# Patient Record
Sex: Male | Born: 1990 | Race: Black or African American | Hispanic: No | Marital: Single | Smoking: Never smoker
Health system: Southern US, Community
[De-identification: ages and names within clinical notes are randomized; demographics above are authoritative.]

## PROBLEM LIST (undated history)

## (undated) ENCOUNTER — Emergency Department (HOSPITAL_BASED_OUTPATIENT_CLINIC_OR_DEPARTMENT_OTHER): Payer: BC Managed Care – PPO

## (undated) DIAGNOSIS — R011 Cardiac murmur, unspecified: Secondary | ICD-10-CM

## (undated) DIAGNOSIS — R079 Chest pain, unspecified: Secondary | ICD-10-CM

## (undated) HISTORY — DX: Cardiac murmur, unspecified: R01.1

## (undated) HISTORY — PX: WISDOM TOOTH EXTRACTION: SHX21

## (undated) HISTORY — DX: Chest pain, unspecified: R07.9

---

## 2010-01-14 ENCOUNTER — Emergency Department (HOSPITAL_COMMUNITY): Admission: EM | Admit: 2010-01-14 | Discharge: 2010-01-14 | Payer: Self-pay | Admitting: Emergency Medicine

## 2011-10-08 ENCOUNTER — Emergency Department (HOSPITAL_COMMUNITY)
Admission: EM | Admit: 2011-10-08 | Discharge: 2011-10-08 | Disposition: A | Discharge status: Home / Self Care | Payer: PRIVATE HEALTH INSURANCE | Source: Home / Self Care | Attending: Family Medicine | Admitting: Family Medicine

## 2011-10-08 ENCOUNTER — Emergency Department (INDEPENDENT_AMBULATORY_CARE_PROVIDER_SITE_OTHER): Payer: PRIVATE HEALTH INSURANCE

## 2011-10-08 ENCOUNTER — Encounter: Payer: Self-pay | Admitting: *Deleted

## 2011-10-08 DIAGNOSIS — S93409A Sprain of unspecified ligament of unspecified ankle, initial encounter: Secondary | ICD-10-CM

## 2011-10-08 NOTE — ED Notes (Signed)
Pt  Reports  About 1  Week  Ago  He  inj  His  r  Ankle  -  He  Applied  A    Support  Bandage  - he  Was  Up  And  About  Bearing  Weight    approx 3  Hours  Ago  He  Twisted  The  Same  Ankle  Playing basketball  He  Reports  Pain  Especially on weight bearing

## 2011-10-08 NOTE — ED Provider Notes (Signed)
History     CSN: [id number] Arrival date & time: 10/08/2011  2:56 PM   First MD Initiated Contact with Patient 10/08/11 1457      Chief Complaint  Patient presents with  . Ankle Pain    (Consider location/radiation/quality/duration/timing/severity/associated sxs/prior treatment) HPI Comments: Jason Leblanc presents for evaluation of pain and swelling in his lateral RIGHT ankle after twisting it earlier today. He reports injuring it again earlier in the week. He now reports pain with walking.  Patient is a 20 y.o. male presenting with ankle pain. The history is provided by the patient.  Ankle Pain  The incident occurred 3 to 5 hours ago. The incident occurred at home. The injury mechanism was torsion. The pain is present in the right ankle. The pain is moderate. The pain has been constant since onset. Associated symptoms include inability to bear weight. Pertinent negatives include no numbness and no tingling. The symptoms are aggravated by activity, bearing weight and palpation. He has tried immobilization for the symptoms.    History reviewed. No pertinent past medical history.  History reviewed. No pertinent past surgical history.  History reviewed. No pertinent family history.  History  Substance Use Topics  . Smoking status: Never Smoker   . Smokeless tobacco: Not on file  . Alcohol Use: Yes     Occasonally      Review of Systems  Constitutional: Negative.   HENT: Negative.   Eyes: Negative.   Respiratory: Negative.   Cardiovascular: Negative.   Gastrointestinal: Negative.   Genitourinary: Negative.   Musculoskeletal: Positive for arthralgias and gait problem.  Skin: Negative.   Neurological: Negative.  Negative for tingling and numbness.    Allergies  Review of patient's allergies indicates no known allergies.  Home Medications  No current outpatient prescriptions on file.  BP 114/67  Pulse 56  Temp(Src) 98.3 F (36.8 C) (Oral)  Resp 16  SpO2  100%  Physical Exam  Nursing note and vitals reviewed. Constitutional: He is oriented to person, place, and time. He appears well-developed and well-nourished.  HENT:  Head: Normocephalic and atraumatic.  Eyes: EOM are normal.  Neck: Normal range of motion.  Pulmonary/Chest: Effort normal.  Musculoskeletal: Normal range of motion.       Right ankle: He exhibits swelling. He exhibits no deformity. tenderness. Lateral malleolus, AITFL, CF ligament, posterior TFL and proximal fibula tenderness found. No medial malleolus and no head of 5th metatarsal tenderness found.       Feet:  Neurological: He is alert and oriented to person, place, and time.  Skin: Skin is warm and dry.  Psychiatric: His behavior is normal.    ED Course  Procedures (including critical care time)  Labs Reviewed - No data to display Dg Ankle Complete Right  10/08/2011  *RADIOLOGY REPORT*  Clinical Data: Injury pain laterally.  RIGHT ANKLE - COMPLETE 3+ VIEW  Comparison: None.  Findings: Soft tissue swelling lateral malleolar region without underlying fracture or dislocation.  IMPRESSION: Soft tissue swelling lateral malleolar region.  No fracture or dislocation  Original Report Authenticated By: Jason Leblanc, M.D.     1. Ankle sprain       MDM  Xray, RIGHT ankle: reviewed by radiologist and myself; no acute findings, no fracture        Richardo Priest, MD 10/08/11 1635

## 2013-02-27 ENCOUNTER — Emergency Department (HOSPITAL_COMMUNITY)
Admission: EM | Admit: 2013-02-27 | Discharge: 2013-02-27 | Disposition: A | Discharge status: Home / Self Care | Payer: BC Managed Care – PPO | Attending: Emergency Medicine | Admitting: Emergency Medicine

## 2013-02-27 ENCOUNTER — Encounter (HOSPITAL_COMMUNITY): Payer: Self-pay | Admitting: *Deleted

## 2013-02-27 DIAGNOSIS — R109 Unspecified abdominal pain: Secondary | ICD-10-CM

## 2013-02-27 DIAGNOSIS — R1084 Generalized abdominal pain: Secondary | ICD-10-CM | POA: Insufficient documentation

## 2013-02-27 LAB — CBC WITH DIFFERENTIAL/PLATELET
Eosinophils Relative: 4 % (ref 0–5)
HCT: 38 % — ABNORMAL LOW (ref 39.0–52.0)
Lymphocytes Relative: 43 % (ref 12–46)
Lymphs Abs: 2.5 10*3/uL (ref 0.7–4.0)
MCV: 81.2 fL (ref 78.0–100.0)
Monocytes Absolute: 0.3 10*3/uL (ref 0.1–1.0)
Platelets: 236 10*3/uL (ref 150–400)
RBC: 4.68 MIL/uL (ref 4.22–5.81)
WBC: 5.8 10*3/uL (ref 4.0–10.5)

## 2013-02-27 LAB — COMPREHENSIVE METABOLIC PANEL
ALT: 10 U/L (ref 0–53)
CO2: 27 mEq/L (ref 19–32)
Calcium: 9.4 mg/dL (ref 8.4–10.5)
GFR calc Af Amer: >90 mL/min (ref >90)
GFR calc non Af Amer: >90 mL/min (ref >90)
Glucose, Bld: 94 mg/dL (ref 70–99)
Sodium: 142 mEq/L (ref 135–145)

## 2013-02-27 LAB — URINALYSIS, ROUTINE W REFLEX MICROSCOPIC
Bilirubin Urine: NEGATIVE
Hgb urine dipstick: NEGATIVE
Ketones, ur: NEGATIVE mg/dL
Protein, ur: NEGATIVE mg/dL
Urobilinogen, UA: 1 mg/dL (ref 0.0–1.0)

## 2013-02-27 NOTE — ED Notes (Signed)
Pt presents with c/c of RLQ abdominal pain, st's pain started about 20-25 min ago.  Pain is characterized as sharp, no rebound tenderness present.  No n/v, reports diarrhea yesterday.

## 2013-02-27 NOTE — ED Provider Notes (Signed)
History     CSN: [id number]  Arrival date & time 02/27/13  0529   First MD Initiated Contact with Patient 02/27/13 0600      No chief complaint on file.   (Consider location/radiation/quality/duration/timing/severity/associated sxs/prior treatment) Patient is a 22 y.o. male presenting with abdominal pain. The history is provided by the patient.  Abdominal Pain Pain location:  Generalized Associated symptoms: no chest pain, no diarrhea, no dysuria, no fever, no hematuria, no nausea and no vomiting   Associated symptoms comment:  Abdominal discomfort that started this morning in the RLQ, then moved to epigastric and LLQ areas before resolving. No fever, N, V, or change in bowel habit.    History reviewed. No pertinent past medical history.  History reviewed. No pertinent past surgical history.  No family history on file.  History  Substance Use Topics  . Smoking status: Never Smoker   . Smokeless tobacco: Not on file  . Alcohol Use: Yes     Comment: Occasonally      Review of Systems  Constitutional: Negative.  Negative for fever.  Respiratory: Negative.   Cardiovascular: Negative.  Negative for chest pain.  Gastrointestinal: Positive for abdominal pain. Negative for nausea, vomiting and diarrhea.  Genitourinary: Negative for dysuria, hematuria and flank pain.  Musculoskeletal: Negative.  Negative for myalgias.  Skin: Negative.  Negative for rash.  Psychiatric/Behavioral: Negative for confusion.    Allergies  Review of patient's allergies indicates no known allergies.  Home Medications  No current outpatient prescriptions on file.  BP 151/92  Pulse 59  Temp(Src) 98.4 F (36.9 C) (Oral)  Resp 22  SpO2 100%  Physical Exam  Constitutional: He is oriented to person, place, and time. He appears well-developed and well-nourished. No distress.  HENT:  Head: Normocephalic.  Neck: Normal range of motion. Neck supple.  Cardiovascular: Normal rate and regular  rhythm.   Pulmonary/Chest: Effort normal and breath sounds normal.  Abdominal: Soft. Bowel sounds are normal. There is no tenderness. There is no rebound and no guarding.  Abdomen is completely non-tender to palpation in every quadrant. Soft, normal bowel sounds.   Musculoskeletal: Normal range of motion.  Neurological: He is alert and oriented to person, place, and time.  Skin: Skin is warm and dry. No rash noted.  Psychiatric: He has a normal mood and affect.    ED Course  Procedures (including critical care time)  Labs Reviewed  CBC WITH DIFFERENTIAL - Abnormal; Notable for the following:    HCT 38.0 (*)    All other components within normal limits  COMPREHENSIVE METABOLIC PANEL  LIPASE, BLOOD  URINALYSIS, ROUTINE W REFLEX MICROSCOPIC   Results for orders placed during the hospital encounter of 02/27/13  CBC WITH DIFFERENTIAL      Result Value Range   WBC 5.8  4.0 - 10.5 K/uL   RBC 4.68  4.22 - 5.81 MIL/uL   Hemoglobin 13.1  13.0 - 17.0 g/dL   HCT 46.9 (*) 62.9 - 52.8 %   MCV 81.2  78.0 - 100.0 fL   MCH 28.0  26.0 - 34.0 pg   MCHC 34.5  30.0 - 36.0 g/dL   RDW 41.3  24.4 - 01.0 %   Platelets 236  150 - 400 K/uL   Neutrophils Relative 48  43 - 77 %   Neutro Abs 2.8  1.7 - 7.7 K/uL   Lymphocytes Relative 43  12 - 46 %   Lymphs Abs 2.5  0.7 - 4.0 K/uL   Monocytes  Relative 5  3 - 12 %   Monocytes Absolute 0.3  0.1 - 1.0 K/uL   Eosinophils Relative 4  0 - 5 %   Eosinophils Absolute 0.2  0.0 - 0.7 K/uL   Basophils Relative 1  0 - 1 %   Basophils Absolute 0.0  0.0 - 0.1 K/uL  COMPREHENSIVE METABOLIC PANEL      Result Value Range   Sodium 142  135 - 145 mEq/L   Potassium 3.7  3.5 - 5.1 mEq/L   Chloride 104  96 - 112 mEq/L   CO2 27  19 - 32 mEq/L   Glucose, Bld 94  70 - 99 mg/dL   BUN 9  6 - 23 mg/dL   Creatinine, Ser 2.13  0.50 - 1.35 mg/dL   Calcium 9.4  8.4 - 08.6 mg/dL   Total Protein 7.1  6.0 - 8.3 g/dL   Albumin 4.1  3.5 - 5.2 g/dL   AST 37  0 - 37 U/L   ALT 10   0 - 53 U/L   Alkaline Phosphatase 63  39 - 117 U/L   Total Bilirubin 1.0  0.3 - 1.2 mg/dL   GFR calc non Af Amer >90  >90 mL/min   GFR calc Af Amer >90  >90 mL/min  LIPASE, BLOOD      Result Value Range   Lipase 48  11 - 59 U/L  URINALYSIS, ROUTINE W REFLEX MICROSCOPIC      Result Value Range   Color, Urine YELLOW  YELLOW   APPearance CLEAR  CLEAR   Specific Gravity, Urine 1.021  1.005 - 1.030   pH 5.5  5.0 - 8.0   Glucose, UA NEGATIVE  NEGATIVE mg/dL   Hgb urine dipstick NEGATIVE  NEGATIVE   Bilirubin Urine NEGATIVE  NEGATIVE   Ketones, ur NEGATIVE  NEGATIVE mg/dL   Protein, ur NEGATIVE  NEGATIVE mg/dL   Urobilinogen, UA 1.0  0.0 - 1.0 mg/dL   Nitrite NEGATIVE  NEGATIVE   Leukocytes, UA NEGATIVE  NEGATIVE    No results found.   No diagnosis found. 1. Abdominal pain, resolved   MDM  He remains pain-free on re-evaluation. Labs unremarkable. He is stable for discharge.         Arnoldo Hooker, PA-C 02/27/13 [phone]

## 2013-02-27 NOTE — ED Provider Notes (Signed)
Medical screening examination/treatment/procedure(s) were performed by non-physician practitioner and as supervising physician I was immediately available for consultation/collaboration.   Bert Givans B. Yohanna Tow, MD 02/27/13 0852 

## 2019-01-06 ENCOUNTER — Ambulatory Visit
Admission: EM | Admit: 2019-01-06 | Discharge: 2019-01-06 | Disposition: A | Discharge status: Home / Self Care | Payer: BLUE CROSS/BLUE SHIELD | Attending: Family Medicine | Admitting: Family Medicine

## 2019-01-06 DIAGNOSIS — Z113 Encounter for screening for infections with a predominantly sexual mode of transmission: Secondary | ICD-10-CM | POA: Insufficient documentation

## 2019-01-06 NOTE — Discharge Instructions (Signed)
We are testing you for Gonorrhea, Chlamydia and Trichomonas. We will call you if anything is positive and let you know if you require any further treatment. Please inform partner of any positive results.  Please return if symptoms not improving with treatment, development of fever, nausea, vomiting, abdominal pain, scrotal pain. 

## 2019-01-06 NOTE — ED Triage Notes (Signed)
Pt states wants a STD screening, denies sx's.

## 2019-01-07 NOTE — ED Provider Notes (Signed)
[facility]    CSN: [id number] Arrival date & time: 01/06/19  1450     History   Chief Complaint Chief Complaint  Patient presents with  . Exposure to STD    HPI Jason Leblanc is a 28 y.o. male notes no past medical history presenting today for STD screening.  Patient states that he is concerned about herpes as his partner recently tested positive for herpes.  Neither him or his partner have had any rashes and believes that she had a blood test obtained.  He has not had any symptoms, denies any penile lesions, dysuria, penile discharge.  Denies other sexual partners.  They have been together for many years and neither have had any rashes or breakouts.   HPI  History reviewed. No pertinent past medical history.  There are no active problems to display for this patient.   History reviewed. No pertinent surgical history.     Home Medications    Prior to Admission medications   Not on File    Family History No family history on file.  Social History Social History   Tobacco Use  . Smoking status: Never Smoker  . Smokeless tobacco: Never Used  Substance Use Topics  . Alcohol use: Yes    Comment: Occasonally  . Drug use: No     Allergies   Patient has no known allergies.   Review of Systems Review of Systems  Constitutional: Negative for fever.  HENT: Negative for sore throat.   Respiratory: Negative for shortness of breath.   Cardiovascular: Negative for chest pain.  Gastrointestinal: Negative for abdominal pain, nausea and vomiting.  Genitourinary: Negative for difficulty urinating, discharge, dysuria, frequency, penile pain, penile swelling, scrotal swelling and testicular pain.  Skin: Negative for rash.  Neurological: Negative for dizziness, light-headedness and headaches.     Physical Exam Triage Vital Signs ED Triage Vitals  Enc Vitals Group     BP 01/06/19 1515 125/78     Pulse Rate 01/06/19 1515 60     Resp 01/06/19 1515  18     Temp 01/06/19 1515 98.1 F (36.7 C)     Temp Source 01/06/19 1515 Oral     SpO2 01/06/19 1515 96 %     Weight --      Height --      Head Circumference --      Peak Flow --      Pain Score 01/06/19 1516 0     Pain Loc --      Pain Edu? --      Excl. in [place]? --    No data found.  Updated Vital Signs BP 125/78 (BP Location: Left Arm)   Pulse 60   Temp 98.1 F (36.7 C) (Oral)   Resp 18   SpO2 96%   Visual Acuity Right Eye Distance:   Left Eye Distance:   Bilateral Distance:    Right Eye Near:   Left Eye Near:    Bilateral Near:     Physical Exam Vitals signs and nursing note reviewed.  Constitutional:      Appearance: He is well-developed.     Comments: No acute distress  HENT:     Head: Normocephalic and atraumatic.     Nose: Nose normal.  Eyes:     Conjunctiva/sclera: Conjunctivae normal.  Neck:     Musculoskeletal: Neck supple.  Cardiovascular:     Rate and Rhythm: Normal rate.  Pulmonary:     Effort: Pulmonary effort is  normal. No respiratory distress.  Abdominal:     General: There is no distension.  Musculoskeletal: Normal range of motion.  Skin:    General: Skin is warm and dry.  Neurological:     Mental Status: He is alert and oriented to person, place, and time.       UC Treatments / Results  Labs (all labs ordered are listed, but only abnormal results are displayed) Labs Reviewed  URINE CYTOLOGY ANCILLARY ONLY    EKG None  Radiology No results found.  Procedures Procedures (including critical care time)  Medications Ordered in UC Medications - No data to display  Initial Impression / Assessment and Plan / UC Course  I have reviewed the triage vital signs and the nursing notes.  Pertinent labs & imaging results that were available during my care of the patient were reviewed by me and considered in my medical decision making (see chart for details).     Patient with concern for herpes, discussed unreliable testing  through serology and discussed testing for this is not recommended unless having concerning lesions.  Patient verbalized understanding, did wish to proceed with testing of urine for gonorrhea, chlamydia and trichomonas.  He wished to defer HIV and syphilis testing.  We will hold off on any treatment at this time as he is asymptomatic, will provide treatment if needed.Discussed strict return precautions. Patient verbalized understanding and is agreeable with plan.  Final Clinical Impressions(s) / UC Diagnoses   Final diagnoses:  Screen for STD (sexually transmitted disease)     Discharge Instructions     We are testing you for Gonorrhea, Chlamydia and Trichomonas. We will call you if anything is positive and let you know if you require any further treatment. Please inform partner of any positive results.  Please return if symptoms not improving with treatment, development of fever, nausea, vomiting, abdominal pain, scrotal pain.   ED Prescriptions    None     Controlled Substance Prescriptions [place] Controlled Substance Registry consulted? Not Applicable   Lew Dawes, [place] 01/07/19 1201

## 2019-01-10 LAB — URINE CYTOLOGY ANCILLARY ONLY
Chlamydia: NEGATIVE
Neisseria Gonorrhea: NEGATIVE
TRICH (WINDOWPATH): NEGATIVE

## 2019-05-26 ENCOUNTER — Other Ambulatory Visit: Payer: Self-pay

## 2019-05-26 ENCOUNTER — Ambulatory Visit (INDEPENDENT_AMBULATORY_CARE_PROVIDER_SITE_OTHER): Payer: BC Managed Care – PPO

## 2019-05-26 ENCOUNTER — Ambulatory Visit
Admission: EM | Admit: 2019-05-26 | Discharge: 2019-05-26 | Disposition: A | Discharge status: Home / Self Care | Payer: BC Managed Care – PPO | Attending: Family Medicine | Admitting: Family Medicine

## 2019-05-26 DIAGNOSIS — S63501A Unspecified sprain of right wrist, initial encounter: Secondary | ICD-10-CM

## 2019-05-26 DIAGNOSIS — W010XXA Fall on same level from slipping, tripping and stumbling without subsequent striking against object, initial encounter: Secondary | ICD-10-CM

## 2019-05-26 MED ORDER — IBUPROFEN 800 MG PO TABS: 800.0000 mg | ORAL_TABLET | Freq: Three times a day (TID) | ORAL | 0 refills | Status: AC

## 2019-05-26 NOTE — ED Triage Notes (Signed)
Per pt he fell last night chasing his dog and used his hands to brace himself. Woke up this morning and his right wrist is sore, and hard to grip. Some swelling no deformity noted.

## 2019-05-26 NOTE — ED Provider Notes (Signed)
[facility]    CSN: [id number] Arrival date & time: 05/26/19  1044      History   Chief Complaint Chief Complaint  Patient presents with  . Wrist Injury    HPI Hilton Saephan is a 28 y.o. male no significant past medical history presenting today for evaluation of wrist injury.  Patient states that last night he was chasing his dog slipped and fell the with outstretched hand.  He denied initial pain.  When he woke up this morning he noticed his wrist was sore and has had difficulty gripping especially at work.  He is an English as a second language teacher.  He denies previous injury to this wrist/hand.  Denies numbness or tingling.  HPI  No past medical history on file.  There are no active problems to display for this patient.   No past surgical history on file.     Home Medications    Prior to Admission medications   Medication Sig Start Date End Date Taking? Authorizing Provider  ibuprofen (ADVIL) 800 MG tablet Take 1 tablet (800 mg total) by mouth 3 (three) times daily. 05/26/19   , Elesa Hacker, PA-C    Family History No family history on file.  Social History Social History   Tobacco Use  . Smoking status: Never Smoker  . Smokeless tobacco: Never Used  Substance Use Topics  . Alcohol use: Yes    Comment: Occasonally  . Drug use: No     Allergies   Patient has no known allergies.   Review of Systems Review of Systems  Constitutional: Negative for fatigue and fever.  Eyes: Negative for redness, itching and visual disturbance.  Respiratory: Negative for shortness of breath.   Cardiovascular: Negative for chest pain and leg swelling.  Gastrointestinal: Negative for nausea and vomiting.  Musculoskeletal: Positive for arthralgias. Negative for joint swelling and myalgias.  Skin: Negative for color change, rash and wound.  Neurological: Negative for dizziness, syncope, weakness, light-headedness and headaches.     Physical Exam Triage Vital  Signs ED Triage Vitals  Enc Vitals Group     BP 05/26/19 1053 121/79     Pulse Rate 05/26/19 1053 60     Resp 05/26/19 1053 16     Temp 05/26/19 1053 97.9 F (36.6 C)     Temp Source 05/26/19 1053 Oral     SpO2 05/26/19 1053 98 %     Weight --      Height --      Head Circumference --      Peak Flow --      Pain Score 05/26/19 1052 3     Pain Loc --      Pain Edu? --      Excl. in [place]? --    No data found.  Updated Vital Signs BP 121/79 (BP Location: Right Arm)   Pulse 60   Temp 97.9 F (36.6 C) (Oral)   Resp 16   SpO2 98%   Visual Acuity Right Eye Distance:   Left Eye Distance:   Bilateral Distance:    Right Eye Near:   Left Eye Near:    Bilateral Near:     Physical Exam Vitals signs and nursing note reviewed.  Constitutional:      Appearance: He is well-developed.     Comments: No acute distress  HENT:     Head: Normocephalic and atraumatic.     Nose: Nose normal.  Eyes:     Conjunctiva/sclera: Conjunctivae normal.  Neck:  Musculoskeletal: Neck supple.  Cardiovascular:     Rate and Rhythm: Normal rate.  Pulmonary:     Effort: Pulmonary effort is normal. No respiratory distress.  Abdominal:     General: There is no distension.  Musculoskeletal: Normal range of motion.     Comments: Right wrist: no obvious swelling deformity or discoloration. Tenderness to palpation of distal radius, mild snuffbox tenderness,   Nontender to distal ulna, throughout second through fifth metacarpal. Relatively full active range of motion of wrist, full active range of motion of fingers Radial pulse 2+  Skin:    General: Skin is warm and dry.  Neurological:     Mental Status: He is alert and oriented to person, place, and time.      UC Treatments / Results  Labs (all labs ordered are listed, but only abnormal results are displayed) Labs Reviewed - No data to display  EKG   Radiology Dg Wrist Complete Right  Result Date: 05/26/2019 CLINICAL DATA:  Larey SeatFell on  outstretched hand last night, pain along distal radius, mild snuffbox tenderness EXAM: RIGHT WRIST - COMPLETE 3+ VIEW COMPARISON:  None FINDINGS: Osseous mineralization normal. Joint spaces preserved. No fracture, dislocation, or bone destruction. IMPRESSION: Normal exam. Electronically Signed   By: Ulyses SouthwardMark  Boles M.D.   On: 05/26/2019 11:38    Procedures Procedures (including critical care time)  Medications Ordered in UC Medications - No data to display  Initial Impression / Assessment and Plan / UC Course  I have reviewed the triage vital signs and the nursing notes.  Pertinent labs & imaging results that were available during my care of the patient were reviewed by me and considered in my medical decision making (see chart for details).     X-ray negative, most likely wrist sprain.  Will place in thumb spica given mild snuffbox tenderness.  Anti-inflammatories ice.  Recommended follow-up in 2 weeks if not having improvement for repeat x-ray.Discussed strict return precautions. Patient verbalized understanding and is agreeable with plan.  Final Clinical Impressions(s) / UC Diagnoses   Final diagnoses:  Sprain of right wrist, initial encounter     Discharge Instructions     No fracture Wear brace next 1-2 weeks Use anti-inflammatories for pain/swelling. You may take up to 800 mg Ibuprofen every 8 hours with food. You may supplement Ibuprofen with Tylenol [phone] mg every 8 hours.  Ice  Follow up if not improving in 2 weeks   ED Prescriptions    Medication Sig Dispense Auth. Provider   ibuprofen (ADVIL) 800 MG tablet Take 1 tablet (800 mg total) by mouth 3 (three) times daily. 21 tablet , [place] C, PA-C     Controlled Substance Prescriptions [place] Controlled Substance Registry consulted? Not Applicable   Lew Dawes,  C, [place] 05/26/19 1315

## 2019-05-26 NOTE — Discharge Instructions (Signed)
No fracture Wear brace next 1-2 weeks Use anti-inflammatories for pain/swelling. You may take up to 800 mg Ibuprofen every 8 hours with food. You may supplement Ibuprofen with Tylenol [phone] mg every 8 hours.  Ice  Follow up if not improving in 2 weeks

## 2019-09-20 ENCOUNTER — Ambulatory Visit
Admission: EM | Admit: 2019-09-20 | Discharge: 2019-09-20 | Disposition: A | Discharge status: Home / Self Care | Payer: BC Managed Care – PPO | Attending: Physician Assistant | Admitting: Physician Assistant

## 2019-09-20 DIAGNOSIS — R109 Unspecified abdominal pain: Secondary | ICD-10-CM | POA: Diagnosis not present

## 2019-09-20 DIAGNOSIS — R110 Nausea: Secondary | ICD-10-CM | POA: Diagnosis not present

## 2019-09-20 MED ORDER — FAMOTIDINE 20 MG PO TABS: 20.0000 mg | ORAL_TABLET | Freq: Every day | ORAL | 0 refills | Status: DC

## 2019-09-20 MED ORDER — DICYCLOMINE HCL 20 MG PO TABS: 20.0000 mg | ORAL_TABLET | Freq: Two times a day (BID) | ORAL | 0 refills | Status: DC

## 2019-09-20 NOTE — ED Provider Notes (Signed)
[facility]    CSN: [id number] Arrival date & time: 09/20/19  0910      History   Chief Complaint Chief Complaint  Patient presents with   Abdominal Pain    HPI Jason Leblanc is a 28 y.o. male.   28 year old male comes in for 6-day history of low abdominal pain with nausea.  Patient states 09/01/2019, he had his wisdom teeth removed, and was taking penicillin, Vicodin, Medrol dose pack, ibuprofen as directed.  He finished the medications without problems, but had regular bowel movements during that time.  Started noticing bilateral lower abdominal pain that is sharp/pulsing in sensation.  This is relieved by cold water.  States started drinking celery juice to help with bowel movements.  4 days ago had worsening abdominal pain, with pain being constant.  He had hard solid stool with clear mucus/red streaks 3 days ago, which relieved pain significantly.  He had one episode of self-induced vomiting with no relief of symptoms.  Had intermittent nausea.  Denies fever, chills, body aches.  States was going to see PCP yesterday, but was not able to get appointment.  States came in today as symptoms has not completely resolved.  However, compared to 4 days ago, symptoms have greatly improved.  He now has no nausea, and has intermittent low abdominal discomfort.  He has been eating and drinking normally. Denies history of abdominal surgery.      History reviewed. No pertinent past medical history.  There are no active problems to display for this patient.   Past Surgical History:  Procedure Laterality Date   WISDOM TOOTH EXTRACTION         Home Medications    Prior to Admission medications   Medication Sig Start Date End Date Taking? Authorizing Provider  dicyclomine (BENTYL) 20 MG tablet Take 1 tablet (20 mg total) by mouth 2 (two) times daily. 09/20/19   Tasia Catchings, Andi Layfield V, PA-C  famotidine (PEPCID) 20 MG tablet Take 1 tablet (20 mg total) by mouth daily. 09/20/19   Tasia Catchings,  Sherril Heyward V, PA-C  ibuprofen (ADVIL) 800 MG tablet Take 1 tablet (800 mg total) by mouth 3 (three) times daily. 05/26/19   Wieters, Elesa Hacker, PA-C    Family History No family history on file.  Social History Social History   Tobacco Use   Smoking status: Never Smoker   Smokeless tobacco: Never Used  Substance Use Topics   Alcohol use: Not Currently    Comment: Occasonally   Drug use: No     Allergies   Patient has no known allergies.   Review of Systems Review of Systems  Reason unable to perform ROS: See HPI as above.     Physical Exam Triage Vital Signs ED Triage Vitals  Enc Vitals Group     BP 09/20/19 0920 115/85     Pulse Rate 09/20/19 0920 96     Resp 09/20/19 0920 16     Temp 09/20/19 0920 97.7 F (36.5 C)     Temp Source 09/20/19 0920 Oral     SpO2 09/20/19 0920 98 %     Weight --      Height --      Head Circumference --      Peak Flow --      Pain Score 09/20/19 0921 3     Pain Loc --      Pain Edu? --      Excl. in [place]? --    No data found.  Updated Vital Signs BP 115/85 (BP Location: Left Arm)    Pulse 96    Temp 97.7 F (36.5 C) (Oral)    Resp 16    SpO2 98%   Physical Exam Constitutional:      General: He is not in acute distress.    Appearance: He is well-developed.  HENT:     Head: Normocephalic and atraumatic.  Cardiovascular:     Rate and Rhythm: Normal rate and regular rhythm.     Heart sounds: Normal heart sounds. No murmur. No friction rub. No gallop.   Pulmonary:     Effort: Pulmonary effort is normal.     Breath sounds: Normal breath sounds. No wheezing or rales.  Abdominal:     General: Abdomen is flat. Bowel sounds are normal. There is no distension.     Palpations: Abdomen is soft.     Tenderness: There is abdominal tenderness in the epigastric area. There is no right CVA tenderness, left CVA tenderness, guarding or rebound. Negative signs include Murphy's sign.  Skin:    General: Skin is warm and dry.  Neurological:      Mental Status: He is alert and oriented to person, place, and time.  Psychiatric:        Behavior: Behavior normal.        Judgment: Judgment normal.      UC Treatments / Results  Labs (all labs ordered are listed, but only abnormal results are displayed) Labs Reviewed - No data to display  EKG   Radiology No results found.  Procedures Procedures (including critical care time)  Medications Ordered in UC Medications - No data to display  Initial Impression / Assessment and Plan / UC Course  I have reviewed the triage vital signs and the nursing notes.  Pertinent labs & imaging results that were available during my care of the patient were reviewed by me and considered in my medical decision making (see chart for details).    No alarming signs on exam.  Abdomen soft, +BS, mild epigastric tenderness without guarding or rebound.  Discussed with patient bowel habits most likely disrupted due to penicillin, Vicodin, ibuprofen use.  Discussed possibility of worsening symptoms if trying to adjust symptoms with medications.  At this time, will have patient take Pepcid for possible epigastric discomfort from increased ibuprofen use.  Patient to take probiotics.  Rx of Bentyl called into pharmacy, patient can start if symptoms not improving, and worsening cramping sensation.  Push fluids.  Return precautions given.  Patient expresses understanding and agrees to plan.  Final Clinical Impressions(s) / UC Diagnoses   Final diagnoses:  Abdominal discomfort  Nausea without vomiting   ED Prescriptions    Medication Sig Dispense Auth. Provider   famotidine (PEPCID) 20 MG tablet Take 1 tablet (20 mg total) by mouth daily. 15 tablet Davinci Glotfelty V, PA-C   dicyclomine (BENTYL) 20 MG tablet Take 1 tablet (20 mg total) by mouth 2 (two) times daily. 14 tablet Belinda Fisher, PA-C     PDMP not reviewed this encounter.   Belinda Fisher, PA-C 09/20/19 [phone]

## 2019-09-20 NOTE — ED Triage Notes (Signed)
Pt c/o lower rt abdominal pain with nausea x6days. States had his wisdom teeth removed 09/01/2019 and was on medrol, Vicodin and ibuprofen. Pt states had constipation and started drinking celery juice in which helped but now having "stringing blood clots around stool" and some loose stool.

## 2019-09-20 NOTE — Discharge Instructions (Signed)
No alarming signs on exam. Start pepcid and pick up probiotics over the counter for current symptoms. You can start bentyl for abdominal cramping as needed. Keep hydrated, urine should be clear to pale yellow in color.  Bland diet, advance as tolerated. Monitor for any worsening of symptoms, nausea or vomiting not controlled by medication, worsening abdominal pain, fever, go to the emergency department for further evaluation needed.

## 2019-09-21 ENCOUNTER — Telehealth: Payer: Self-pay | Admitting: Emergency Medicine

## 2019-09-21 NOTE — Telephone Encounter (Signed)
Left voicemail checking in on patient, and encouraged return call with any continuing questions or concerns.    

## 2020-05-02 ENCOUNTER — Ambulatory Visit
Admission: EM | Admit: 2020-05-02 | Discharge: 2020-05-02 | Disposition: A | Discharge status: Home / Self Care | Payer: BC Managed Care – PPO | Attending: Emergency Medicine | Admitting: Emergency Medicine

## 2020-05-02 ENCOUNTER — Encounter: Payer: Self-pay | Admitting: Emergency Medicine

## 2020-05-02 ENCOUNTER — Telehealth: Payer: Self-pay

## 2020-05-02 ENCOUNTER — Other Ambulatory Visit: Payer: Self-pay

## 2020-05-02 DIAGNOSIS — S29012A Strain of muscle and tendon of back wall of thorax, initial encounter: Secondary | ICD-10-CM

## 2020-05-02 MED ORDER — NAPROXEN 250 MG PO TABS: 500.0000 mg | ORAL_TABLET | Freq: Two times a day (BID) | ORAL | 0 refills | Status: AC

## 2020-05-02 MED ORDER — CYCLOBENZAPRINE HCL 5 MG PO TABS: 5.0000 mg | ORAL_TABLET | Freq: Every evening | ORAL | 0 refills | Status: AC | PRN
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
## 2020-05-02 NOTE — Discharge Instructions (Signed)
Recommend RICE: rest, ice, compression, elevation as needed for pain.    Heat therapy (hot compress, warm wash rag, hot showers, etc.) can help relax muscles and soothe muscle aches. Cold therapy (ice packs) can be used to help swelling both after injury and after prolonged use of areas of chronic pain/aches.  For pain: recommend 350 mg-1000 mg of Tylenol (acetaminophen) and/or 200 mg - 800 mg of Advil (ibuprofen, Motrin) every 8 hours as needed.  May alternate between the two throughout the day as they are generally safe to take together.  DO NOT exceed more than 3000 mg of Tylenol or 3200 mg of ibuprofen in a 24 hour period as this could damage your stomach, kidneys, liver, or increase your bleeding risk.  May take muscle relaxer as needed for severe pain / spasm.  (This medication may cause you to become tired so it is important you do not drink alcohol or operate heavy machinery while on this medication.  Recommend your first dose to be taken before bedtime to monitor for side effects safely) 

## 2020-11-23 IMAGING — DX RIGHT WRIST - COMPLETE 3+ VIEW
4 series · 4 of 4 positions shown · non-contrast
Comparison: None

CLINICAL DATA: Fell on outstretched hand last night, pain along
distal radius, mild snuffbox tenderness

EXAM:
RIGHT WRIST - COMPLETE 3+ VIEW

[wrist pa (1 of 2)]
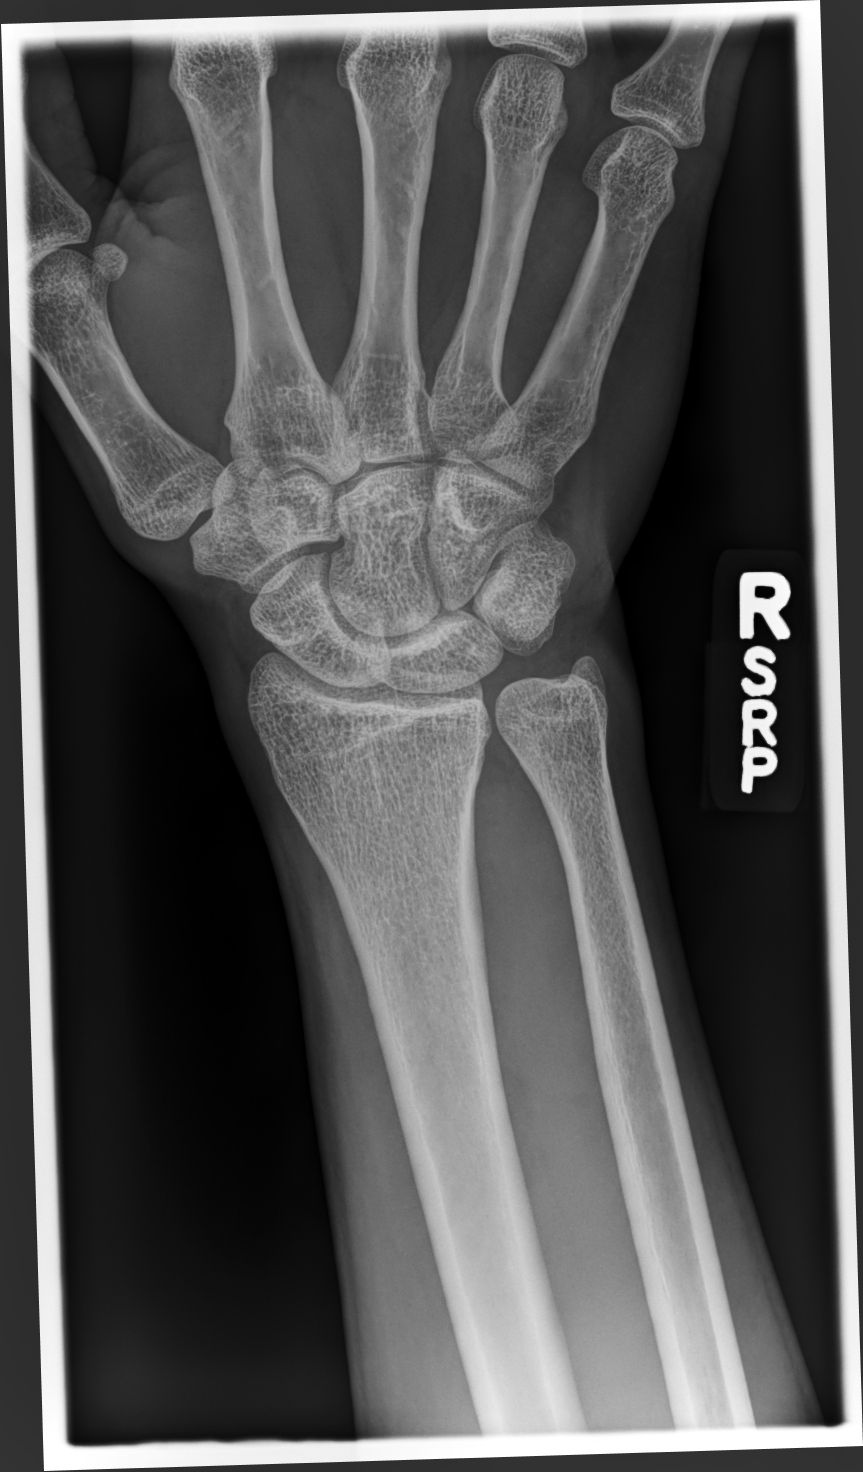

[hand mlo]
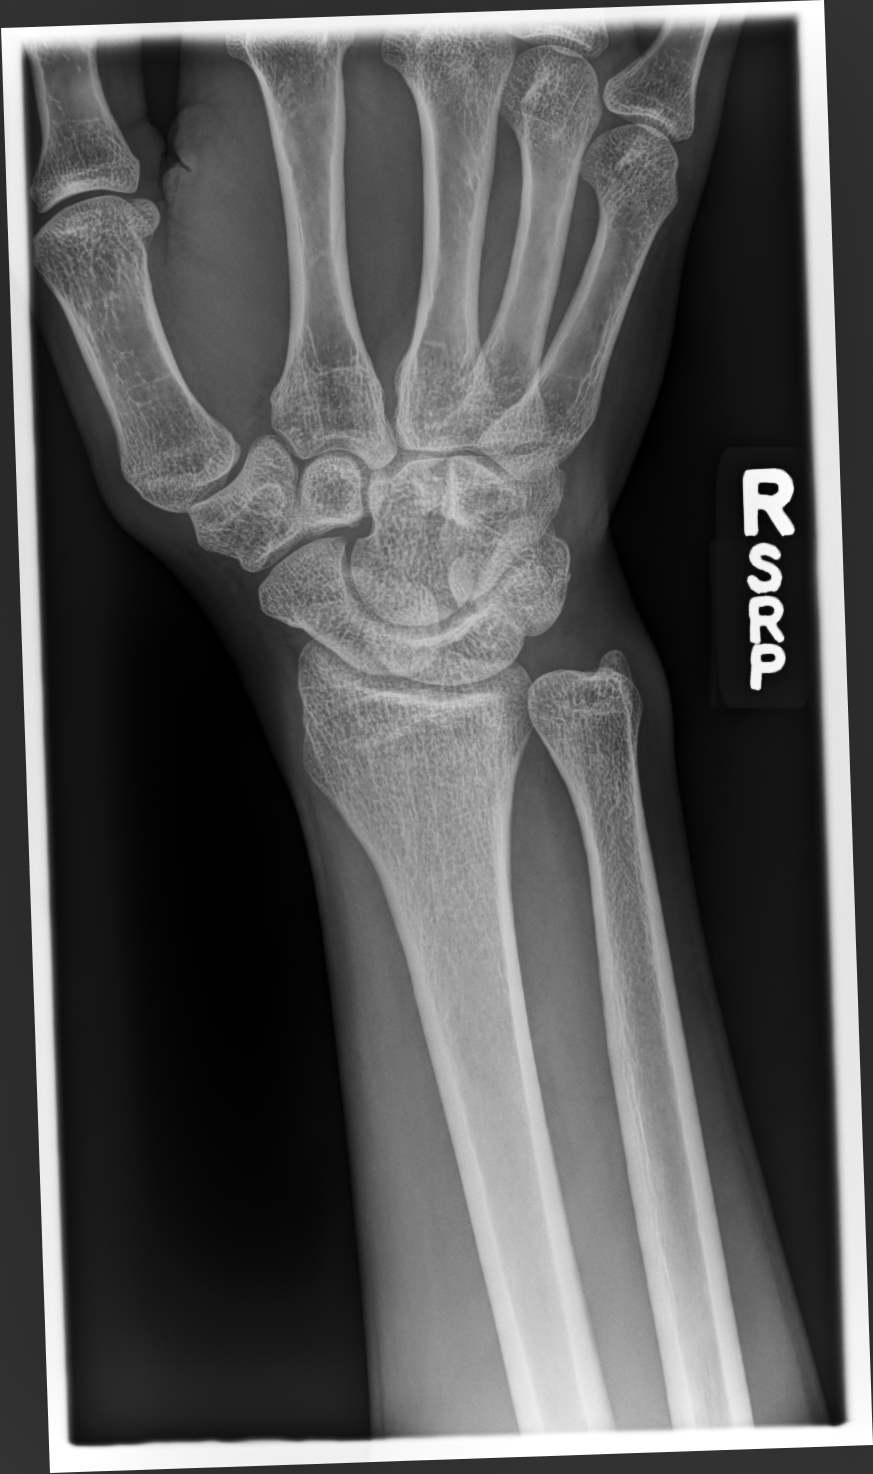

[wrist lat]
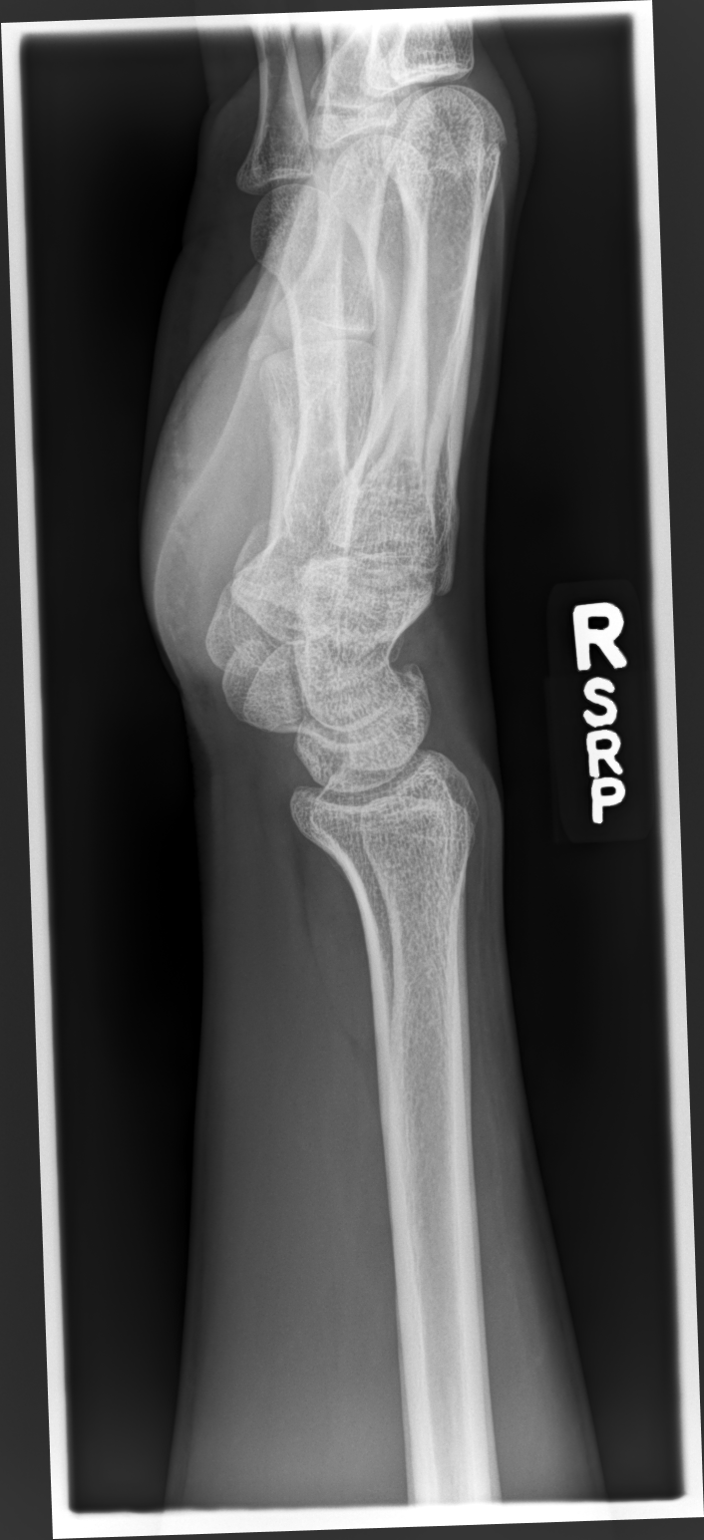

[wrist pa (2 of 2)]
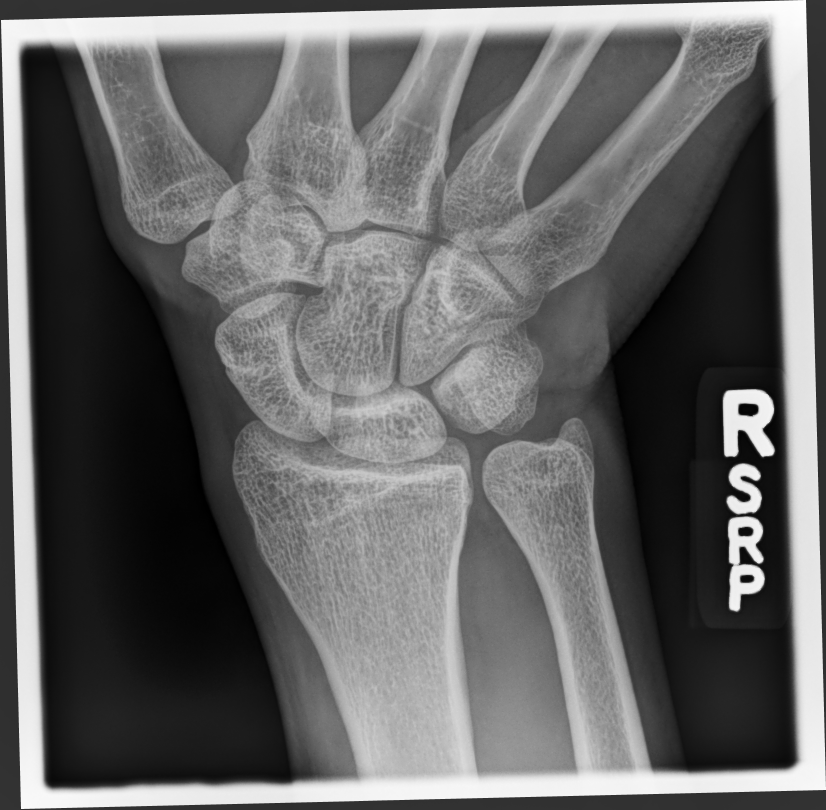

[4 of 4 positions shown; findings below may reference images not displayed]

FINDINGS: Osseous mineralization normal.

Joint spaces preserved.

No fracture, dislocation, or bone destruction.
IMPRESSION: Normal exam.

## 2022-06-12 ENCOUNTER — Other Ambulatory Visit: Payer: Self-pay

## 2022-06-12 ENCOUNTER — Encounter (HOSPITAL_BASED_OUTPATIENT_CLINIC_OR_DEPARTMENT_OTHER): Payer: Self-pay

## 2022-06-12 ENCOUNTER — Emergency Department (HOSPITAL_BASED_OUTPATIENT_CLINIC_OR_DEPARTMENT_OTHER): Payer: Worker's Compensation

## 2022-06-12 ENCOUNTER — Emergency Department (HOSPITAL_BASED_OUTPATIENT_CLINIC_OR_DEPARTMENT_OTHER)
Admission: EM | Admit: 2022-06-12 | Discharge: 2022-06-13 | Disposition: A | Discharge status: Home / Self Care | Payer: Worker's Compensation | Attending: Emergency Medicine | Admitting: Emergency Medicine

## 2022-06-12 DIAGNOSIS — Y990 Civilian activity done for income or pay: Secondary | ICD-10-CM | POA: Diagnosis not present

## 2022-06-12 DIAGNOSIS — Y9301 Activity, walking, marching and hiking: Secondary | ICD-10-CM | POA: Insufficient documentation

## 2022-06-12 DIAGNOSIS — X58XXXA Exposure to other specified factors, initial encounter: Secondary | ICD-10-CM | POA: Insufficient documentation

## 2022-06-12 DIAGNOSIS — S99911A Unspecified injury of right ankle, initial encounter: Secondary | ICD-10-CM | POA: Diagnosis present

## 2022-06-12 DIAGNOSIS — S93401A Sprain of unspecified ligament of right ankle, initial encounter: Secondary | ICD-10-CM | POA: Diagnosis not present

## 2022-06-12 MED ORDER — NAPROXEN 250 MG PO TABS
500.0000 mg | ORAL_TABLET | Freq: Once | ORAL | Status: AC
  Administered 2022-06-12: 500 mg via ORAL
  Filled 2022-06-12: qty 2

## 2022-06-12 NOTE — ED Provider Notes (Signed)
   [facility] EMERGENCY DEPT  Provider Note  CSN: [id number] Arrival date & time: 06/12/22 2224  History Chief Complaint  Patient presents with   Foot Injury    Aqeel Norgaard is a 31 y.o. 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 He put on an old brace, but was still having pain after work prompting his ED visit.    Home Medications Prior to Admission medications   Medication Sig Start Date End Date Taking? Authorizing Provider  cyclobenzaprine (FLEXERIL) 5 MG tablet Take 1 tablet (5 mg total) by mouth at bedtime as needed for muscle spasms. 05/02/20   Hall-Potvin, [place], PA-C  ibuprofen (ADVIL) 800 MG tablet Take 1 tablet (800 mg total) by mouth 3 (three) times daily. 05/26/19   Wieters, Hallie C, PA-C  naproxen (NAPROSYN) 250 MG tablet Take 2 tablets (500 mg total) by mouth 2 (two) times daily with a meal. 05/02/20   Hall-Potvin, [place], PA-C     Allergies    Patient has no known allergies.   Review of Systems   Review of Systems Please see HPI for pertinent positives and negatives  Physical Exam BP (!) 130/90 (BP Location: Left Arm)   Pulse 91   Temp 98.2 F (36.8 C)   Resp 15   Ht 6\' 2"  (1.88 m)   Wt 68 kg   SpO2 100%   BMI 19.26 kg/m   Physical Exam Vitals and nursing note reviewed.  HENT:     Head: Normocephalic.     Nose: Nose normal.  Eyes:     Extraocular Movements: Extraocular movements intact.  Pulmonary:     Effort: Pulmonary effort is normal.  Musculoskeletal:        General: Tenderness (lateral malleolus) present. No swelling or deformity. Normal range of motion.     Cervical back: Neck supple.  Skin:    Findings: No rash (on exposed skin).  Neurological:     Mental Status: He is alert and oriented to person, place, and time.  Psychiatric:        Mood and Affect: Mood normal.     ED Results / Procedures / Treatments    EKG None  Procedures Procedures  Medications Ordered in the ED Medications  naproxen (NAPROSYN) tablet 500 mg (has no administration in time range)    Initial Impression and Plan  Patient with R ankle injury. I personally viewed the images from radiology studies and agree with radiologist interpretation: Xrays are neg. Likely a sprain. Will place in new ASO, crutches for rest. Advised to elevate and ice. Weight bearing as tolerated. Ortho follow up if not improving.    ED Course       MDM Rules/Calculators/A&P Medical Decision Making Problems Addressed: Sprain of right ankle, unspecified ligament, initial encounter: acute illness or injury  Amount and/or Complexity of Data Reviewed Radiology: ordered and independent interpretation performed. Decision-making details documented in ED Course.  Risk Prescription drug management.    Final Clinical Impression(s) / ED Diagnoses Final diagnoses:  Sprain of right ankle, unspecified ligament, initial encounter    Rx / DC Orders ED Discharge Orders     None        , MD 06/12/22 2337
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
## 2022-12-09 ENCOUNTER — Emergency Department (HOSPITAL_COMMUNITY)
Admission: EM | Admit: 2022-12-09 | Discharge: 2022-12-09 | Disposition: A | Discharge status: Home / Self Care | Payer: BC Managed Care – PPO | Attending: Emergency Medicine | Admitting: Emergency Medicine

## 2022-12-09 ENCOUNTER — Other Ambulatory Visit: Payer: Self-pay

## 2022-12-09 ENCOUNTER — Encounter (HOSPITAL_COMMUNITY): Payer: Self-pay | Admitting: Emergency Medicine

## 2022-12-09 ENCOUNTER — Emergency Department (HOSPITAL_COMMUNITY): Payer: BC Managed Care – PPO

## 2022-12-09 DIAGNOSIS — R079 Chest pain, unspecified: Secondary | ICD-10-CM | POA: Insufficient documentation

## 2022-12-09 LAB — CBC
HCT: 43.7 % (ref 39.0–52.0)
Hemoglobin: 13.8 g/dL (ref 13.0–17.0)
MCH: 27.6 pg (ref 26.0–34.0)
MCHC: 31.6 g/dL (ref 30.0–36.0)
MCV: 87.4 fL (ref 80.0–100.0)
Platelets: 232 10*3/uL (ref 150–400)
RBC: 5 MIL/uL (ref 4.22–5.81)
RDW: 12.5 % (ref 11.5–15.5)
WBC: 4.2 10*3/uL (ref 4.0–10.5)
nRBC: 0 % (ref 0.0–0.2)

## 2022-12-09 LAB — TROPONIN I (HIGH SENSITIVITY)
Troponin I (High Sensitivity): <2 ng/L (ref <18)
Troponin I (High Sensitivity): 2 ng/L (ref <18)

## 2022-12-09 LAB — BASIC METABOLIC PANEL
Anion gap: 9 (ref 5–15)
BUN: 16 mg/dL (ref 6–20)
CO2: 25 mmol/L (ref 22–32)
Calcium: 9.3 mg/dL (ref 8.9–10.3)
Chloride: 105 mmol/L (ref 98–111)
Creatinine, Ser: 1.21 mg/dL (ref 0.61–1.24)
GFR, Estimated: >60 mL/min (ref >60)
Glucose, Bld: 101 mg/dL — ABNORMAL HIGH (ref 70–99)
Potassium: 5.3 mmol/L — ABNORMAL HIGH (ref 3.5–5.1)
Sodium: 139 mmol/L (ref 135–145)

## 2022-12-09 NOTE — ED Provider Notes (Signed)
[facility] Provider Note   CSN: [id number] Arrival date & time: 12/09/22  1051     History  Chief Complaint  Patient presents with   Chest Pain    Jason Leblanc is a 32 y.o. male.  Patient presents the emergency room complaining of left-sided chest pain with radiation to the right side of the chest.  Patient states he has been having chest pains which have waxed and waned over the past month.  He was evaluated by urgent care earlier this morning who was concerned over his EKG and recommended that he come to the emergency department for further evaluation.  The patient denies radiation of symptoms into the arm, neck, jaw.  Denies shortness of breath, nausea, vomiting, abdominal pain, headache, urinary symptoms.  He can appreciate no aggravating factors.  He does state that his chest pain feels somewhat better when he leans forward.  No relevant past medical history is on file  HPI     Home Medications Prior to Admission medications   Medication Sig Start Date End Date Taking? Authorizing Provider  cyclobenzaprine (FLEXERIL) 5 MG tablet Take 1 tablet (5 mg total) by mouth at bedtime as needed for muscle spasms. 05/02/20   Hall-Potvin, [place], PA-C  ibuprofen (ADVIL) 800 MG tablet Take 1 tablet (800 mg total) by mouth 3 (three) times daily. 05/26/19   Wieters, Hallie C, PA-C  naproxen (NAPROSYN) 250 MG tablet Take 2 tablets (500 mg total) by mouth 2 (two) times daily with a meal. 05/02/20   Hall-Potvin, [place], PA-C      Allergies    Patient has no known allergies.    Review of Systems   Review of Systems  Respiratory:  Negative for shortness of breath.   Cardiovascular:  Positive for chest pain. Negative for palpitations and leg swelling.  Gastrointestinal:  Negative for abdominal pain, nausea and vomiting.  Genitourinary:  Negative for dysuria.    Physical Exam Updated Vital Signs BP 127/77   Pulse (!) 59   Temp 98.2 F  (36.8 C) (Oral)   Resp 14   Ht 6' 2"$  (1.88 m)   Wt 68 kg   SpO2 100%   BMI 19.26 kg/m  Physical Exam Vitals and nursing note reviewed.  Constitutional:      General: He is not in acute distress.    Appearance: He is well-developed.  HENT:     Head: Normocephalic and atraumatic.  Eyes:     Conjunctiva/sclera: Conjunctivae normal.  Cardiovascular:     Rate and Rhythm: Normal rate and regular rhythm.     Heart sounds: No murmur heard. Pulmonary:     Effort: Pulmonary effort is normal. No respiratory distress.     Breath sounds: Normal breath sounds.  Chest:     Chest wall: No tenderness.  Abdominal:     Palpations: Abdomen is soft.     Tenderness: There is no abdominal tenderness.  Musculoskeletal:        General: No swelling.     Cervical back: Neck supple.  Skin:    General: Skin is warm and dry.     Capillary Refill: Capillary refill takes less than 2 seconds.  Neurological:     Mental Status: He is alert.  Psychiatric:        Mood and Affect: Mood normal.     ED Results / Procedures / Treatments   Labs (all labs ordered are listed, but only abnormal results are displayed) Labs Reviewed  BASIC METABOLIC PANEL - Abnormal; Notable for the following components:      Result Value   Potassium 5.3 (*)    Glucose, Bld 101 (*)    All other components within normal limits  CBC  TROPONIN I (HIGH SENSITIVITY)  TROPONIN I (HIGH SENSITIVITY)    EKG EKG Interpretation  Date/Time:  Tuesday December 09 2022 10:56:46 EST Ventricular Rate:  66 PR Interval:  153 QRS Duration: 86 QT Interval:  369 QTC Calculation: 387 R Axis:   32 Text Interpretation: Sinus rhythm Anteroseptal infarct, old ST elevation suggests acute pericarditis No previous tracing Confirmed by Blanchie Dessert [phone]) on 12/09/2022 2:35:09 PM  Radiology DG Chest 2 View  Result Date: 12/09/2022 CLINICAL DATA:  Chest pain EXAM: CHEST - 2 VIEW COMPARISON:  None Available. FINDINGS: The heart size and  mediastinal contours are within normal limits. Both lungs are clear. The visualized skeletal structures are unremarkable. IMPRESSION: No active cardiopulmonary disease. Electronically Signed   By: Jerilynn Mages.  Shick M.D.   On: 12/09/2022 12:07    Procedures Procedures    Medications Ordered in ED Medications - No data to display  ED Course/ Medical Decision Making/ A&P                             Medical Decision Making Amount and/or Complexity of Data Reviewed Labs: ordered. Radiology: ordered.   This patient presents to the ED for concern of chest pain, this involves an extensive number of treatment options, and is a complaint that carries with it a high risk of complications and morbidity.  The differential diagnosis includes ACS, pneumonia, pulmonary embolism, musculoskeletal pain, others   Co morbidities that complicate the patient evaluation  None   Additional history obtained:   No recent relevant medical charts are available for review   Lab Tests:  I Ordered, and personally interpreted labs.  The pertinent results include: Initial troponin less than 2, potassium 5.3   Imaging Studies ordered:  I ordered imaging studies including chest x-ray I independently visualized and interpreted imaging which showed no active disease I agree with the radiologist interpretation   Cardiac Monitoring: / EKG:  The patient was maintained on a cardiac monitor.  I personally viewed and interpreted the cardiac monitored which showed an underlying rhythm of: Sinus rhythm   Social Determinants of Health:  Patient unsure if he still has a PCP   Test / Admission - Considered:  Patient's troponin are reassuring with nonischemic EKG.  Very low clinical suspicion of ACS.  No shortness of breath or tachycardia to/PE.  Symptoms been ongoing for a month at this point.  Doubt any life-threatening emergent etiology at this time.  Plan to discharge patient home with plans for outpatient  cardiology follow-up.  Patient voices understanding.  Return precautions provided        Final Clinical Impression(s) / ED Diagnoses Final diagnoses:  Nonspecific chest pain    Rx / DC Orders ED Discharge Orders          Ordered    Ambulatory referral to Cardiology       Comments: If you have not heard from the Cardiology office within the next 72 hours please call [phone].   12/09/22 Goldthwaite, Arfa Lamarca B, PA-C 12/09/22 1506    Blanchie Dessert, MD 12/09/22 Greer Ee

## 2022-12-09 NOTE — ED Triage Notes (Signed)
Patient arrives ambulatory by POV sent from [facility] for abnormal EKG. Patient has been having intermittent central chest pain x 1 month. Patient states pain is worse when he has not eaten. States pain sometimes radiates up into his chest.

## 2022-12-09 NOTE — Discharge Instructions (Addendum)
You were evaluated today for chest pain.  Your workup was reassuring.  I have placed a referral to cardiology for follow-up.  Please schedule an appointment with their office.  Worsening chest pain, shortness of breath, or other life-threatening symptoms please return to the emergency department.

## 2022-12-11 NOTE — Progress Notes (Signed)
Referring-Jason Amie Critchley, PA-C Reason for referral-chest pain  HPI: 32 year old male for evaluation of chest pain at request of Jason June, PA-C.  Patient seen in the emergency room February 13 with chest pain.  Chest x-ray showed no acute infiltrate.  Hemoglobin 13.8, troponin less than 2 and 2.  Creatinine 1.21 and potassium 5.3.  Patient states he has had chest pain since January.  The pain is in the epigastric area and radiates to the left into the right intermittently.  It is described as a sharp pain followed by a "lingering pain".  The pain never completely resolves.  It is not clearly exertional.  It gets worse when he has not eaten.  He has had mild nausea but no vomiting.  He denies dyspnea or diaphoresis.  Because of the above cardiology now asked to evaluate.  Current Outpatient Medications  Medication Sig Dispense Refill   cyclobenzaprine (FLEXERIL) 5 MG tablet Take 1 tablet (5 mg total) by mouth at bedtime as needed for muscle spasms. (Patient not taking: Reported on 12/17/2022) 15 tablet 0   ibuprofen (ADVIL) 800 MG tablet Take 1 tablet (800 mg total) by mouth 3 (three) times daily. (Patient not taking: Reported on 12/17/2022) 21 tablet 0   naproxen (NAPROSYN) 250 MG tablet Take 2 tablets (500 mg total) by mouth 2 (two) times daily with a meal. (Patient not taking: Reported on 12/17/2022) 14 tablet 0   No current facility-administered medications for this visit.    No Known Allergies   Past Medical History:  Diagnosis Date   Chest pain    Murmur     Past Surgical History:  Procedure Laterality Date   WISDOM TOOTH EXTRACTION      Social History   Socioeconomic History   Marital status: Single    Spouse name: Not on file   Number of children: Not on file   Years of education: Not on file   Highest education level: Not on file  Occupational History   Not on file  Tobacco Use   Smoking status: Never   Smokeless tobacco: Never  Substance and Sexual Activity    Alcohol use: Yes    Comment: Occasonally   Drug use: No   Sexual activity: Not on file  Other Topics Concern   Not on file  Social History Narrative   Not on file   Social Determinants of Health   Financial Resource Strain: Not on file  Food Insecurity: Not on file  Transportation Needs: Not on file  Physical Activity: Not on file  Stress: Not on file  Social Connections: Not on file  Intimate Partner Violence: Not on file    History reviewed. No pertinent family history.  ROS: no fevers or chills, productive cough, hemoptysis, dysphasia, odynophagia, melena, hematochezia, dysuria, hematuria, rash, seizure activity, orthopnea, PND, pedal edema, claudication. Remaining systems are negative.  Physical Exam:   Blood pressure 106/70, pulse 70, height 6' 2"$  (1.88 m), weight 159 lb (72.1 kg), SpO2 98 %.  General:  Well developed/well nourished in NAD Skin warm/dry Patient not depressed No peripheral clubbing Back-normal HEENT-normal/normal eyelids Neck supple/normal carotid upstroke bilaterally; no bruits; no JVD; no thyromegaly chest - CTA/ normal expansion CV - RRR/normal S1 and S2; no murmurs, rubs or gallops;  PMI nondisplaced Abdomen -NT/ND, no HSM, no mass, + bowel sounds, no bruit 2+ femoral pulses, no bruits Ext-no edema, chords, 2+ DP Neuro-grossly nonfocal  ECG -December 09, 2022-normal sinus rhythm, cannot rule out septal infarct.  Personally reviewed  A/P  1 chest pain-symptoms are very atypical.  They have been essentially continuous for greater than 1 month.  Electrocardiogram previously showed no ST changes and troponins were normal.  I will arrange an echocardiogram to assess LV function.  If normal we will not pursue further cardiac evaluation.  I have asked him to follow-up with his primary care physician for this issue as well and he may need GI evaluation.  He did describe some hematochezia at times which also needs to be evaluated.  Patient in  agreement.  Kirk Ruths, MD

## 2022-12-17 ENCOUNTER — Ambulatory Visit: Payer: BC Managed Care – PPO | Attending: Cardiology | Admitting: Cardiology

## 2022-12-17 ENCOUNTER — Encounter: Payer: Self-pay | Admitting: Cardiology

## 2022-12-17 VITALS — BP 106/70 | HR 70 | Ht 74.0 in | Wt 159.0 lb

## 2022-12-17 DIAGNOSIS — R072 Precordial pain: Secondary | ICD-10-CM

## 2022-12-17 NOTE — Patient Instructions (Signed)
  Testing/Procedures:  Your physician has requested that you have an echocardiogram. Echocardiography is a painless test that uses sound waves to create images of your heart. It provides your doctor with information about the size and shape of your heart and how well your heart's chambers and valves are working. This procedure takes approximately one hour. There are no restrictions for this procedure. Please do NOT wear cologne, perfume, aftershave, or lotions (deodorant is allowed). Please arrive 15 minutes prior to your appointment time. [address]   Follow-Up: At [facility], you and your health needs are our priority.  As part of our continuing mission to provide you with exceptional heart care, we have created designated Provider Care Teams.  These Care Teams include your primary Cardiologist (physician) and Advanced Practice Providers (APPs -  Physician Assistants and Nurse Practitioners) who all work together to provide you with the care you need, when you need it.  We recommend signing up for the patient portal called "MyChart".  Sign up information is provided on this After Visit Summary.  MyChart is used to connect with patients for Virtual Visits (Telemedicine).  Patients are able to view lab/test results, encounter notes, upcoming appointments, etc.  Non-urgent messages can be sent to your provider as well.   To learn more about what you can do with MyChart, go to [email or web address].    Your next appointment:    AS NEEDED   

## 2022-12-22 ENCOUNTER — Ambulatory Visit (HOSPITAL_COMMUNITY): Payer: BC Managed Care – PPO | Attending: Cardiology

## 2022-12-22 DIAGNOSIS — R072 Precordial pain: Secondary | ICD-10-CM | POA: Insufficient documentation

## 2022-12-22 LAB — ECHOCARDIOGRAM COMPLETE
Area-P 1/2: 5.27 cm2
S' Lateral: 2.9 cm
